# Patient Record
Sex: Female | Born: 1965
Health system: Southern US, Community
[De-identification: ages and names within clinical notes are randomized; demographics above are authoritative.]

## PROBLEM LIST (undated history)

## (undated) DIAGNOSIS — I10 Essential (primary) hypertension: Secondary | ICD-10-CM

---

## 2013-12-05 ENCOUNTER — Emergency Department (HOSPITAL_COMMUNITY)
Admission: EM | Admit: 2013-12-05 | Discharge: 2013-12-05 | Disposition: A | Discharge status: Home / Self Care | Payer: No Typology Code available for payment source | Attending: Emergency Medicine | Admitting: Emergency Medicine

## 2013-12-05 ENCOUNTER — Encounter (HOSPITAL_COMMUNITY): Payer: Self-pay | Admitting: Emergency Medicine

## 2013-12-05 DIAGNOSIS — S29009A Unspecified injury of muscle and tendon of unspecified wall of thorax, initial encounter: Secondary | ICD-10-CM | POA: Insufficient documentation

## 2013-12-05 DIAGNOSIS — Y9389 Activity, other specified: Secondary | ICD-10-CM | POA: Insufficient documentation

## 2013-12-05 DIAGNOSIS — I10 Essential (primary) hypertension: Secondary | ICD-10-CM | POA: Insufficient documentation

## 2013-12-05 DIAGNOSIS — S199XXA Unspecified injury of neck, initial encounter: Secondary | ICD-10-CM | POA: Diagnosis present

## 2013-12-05 DIAGNOSIS — Y92410 Unspecified street and highway as the place of occurrence of the external cause: Secondary | ICD-10-CM | POA: Insufficient documentation

## 2013-12-05 DIAGNOSIS — S39012A Strain of muscle, fascia and tendon of lower back, initial encounter: Secondary | ICD-10-CM | POA: Insufficient documentation

## 2013-12-05 DIAGNOSIS — S161XXA Strain of muscle, fascia and tendon at neck level, initial encounter: Secondary | ICD-10-CM | POA: Diagnosis not present

## 2013-12-05 DIAGNOSIS — T148XXA Other injury of unspecified body region, initial encounter: Secondary | ICD-10-CM

## 2013-12-05 HISTORY — DX: Essential (primary) hypertension: I10

## 2013-12-05 MED ORDER — METHOCARBAMOL 500 MG PO TABS: 1000.0000 mg | ORAL_TABLET | Freq: Four times a day (QID) | ORAL | Status: DC

## 2013-12-05 MED ORDER — METHOCARBAMOL 500 MG PO TABS
500.0000 mg | ORAL_TABLET | Freq: Once | ORAL | Status: AC
  Administered 2013-12-05: 500 mg via ORAL
  Filled 2013-12-05: qty 1
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
## 2013-12-07 NOTE — ED Provider Notes (Signed)
Medical screening examination/treatment/procedure(s) were performed by non-physician practitioner and as supervising physician I was immediately available for consultation/collaboration.   EKG Interpretation None       Khyri Hinzman, MD 12/07/13 1116 

## 2016-12-02 ENCOUNTER — Encounter: Payer: Self-pay | Admitting: *Deleted

## 2016-12-03 ENCOUNTER — Encounter: Payer: Self-pay | Admitting: Diagnostic Neuroimaging

## 2016-12-03 ENCOUNTER — Ambulatory Visit (INDEPENDENT_AMBULATORY_CARE_PROVIDER_SITE_OTHER): Payer: BLUE CROSS/BLUE SHIELD | Admitting: Diagnostic Neuroimaging

## 2016-12-03 ENCOUNTER — Encounter (INDEPENDENT_AMBULATORY_CARE_PROVIDER_SITE_OTHER): Payer: Self-pay

## 2016-12-03 VITALS — BP 133/87 | HR 63 | Ht 63.0 in | Wt 232.2 lb

## 2016-12-03 DIAGNOSIS — H538 Other visual disturbances: Secondary | ICD-10-CM

## 2016-12-03 DIAGNOSIS — G4700 Insomnia, unspecified: Secondary | ICD-10-CM | POA: Diagnosis not present

## 2016-12-03 DIAGNOSIS — R413 Other amnesia: Secondary | ICD-10-CM
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
## 2016-12-03 NOTE — Patient Instructions (Signed)
Thank you for coming to see [place] at [facility]. I hope we have been able to provide you high quality care today.  You may receive a patient satisfaction survey over the next few weeks. We would appreciate your feedback and comments so that we may continue to improve ourselves and the health of our patients.  - check MRI brain and sleep study   ~~~~~~~~~~~~~~~~~~~~~~~~~~~~~~~~~~~~~~~~~~~~~~~~~~~~~~~~~~~~~~~~~  DR. Ileane Sando'S GUIDE TO HAPPY AND HEALTHY LIVING These are some of my general health and wellness recommendations. Some of them may apply to you better than others. Please use common sense as you try these suggestions and feel free to ask me any questions.   ACTIVITY/FITNESS Mental, social, emotional and physical stimulation are very important for brain and body health. Try learning a new activity (arts, music, language, sports, games).  Keep moving your body to the best of your abilities. You can do this at home, inside or outside, the park, community center, gym or anywhere you like. Consider a physical therapist or personal trainer to get started. Consider the app Sworkit. Fitness trackers such as smart-watches, smart-phones or Fitbits can help as well.   NUTRITION Eat more plants: colorful vegetables, nuts, seeds and berries.  Eat less sugar, salt, preservatives and processed foods.  Avoid toxins such as cigarettes and alcohol.  Drink water when you are thirsty. Warm water with a slice of lemon is an excellent morning drink to start the day.  Consider these websites for more information The Nutrition Source (https://www.henry-hernandez.biz/) [employer] Nutrition (WindowBlog.ch)   RELAXATION Consider practicing mindfulness meditation or other relaxation techniques such as deep breathing, prayer, yoga, tai chi, massage. See website mindful.org or the apps Headspace or Calm to help get started.   SLEEP Try to get  at least 7-8+ hours sleep per day. Regular exercise and reduced caffeine will help you sleep better. Practice good sleep hygeine techniques. See website sleep.org for more information.   PLANNING Prepare estate planning, living will, healthcare POA documents. Sometimes this is best planned with the help of an attorney. Theconversationproject.org and agingwithdignity.org are excellent resources.

## 2017-01-07 ENCOUNTER — Ambulatory Visit (INDEPENDENT_AMBULATORY_CARE_PROVIDER_SITE_OTHER): Payer: BLUE CROSS/BLUE SHIELD | Admitting: Neurology

## 2017-01-07 ENCOUNTER — Encounter: Payer: Self-pay | Admitting: Neurology

## 2017-01-07 VITALS — BP 148/92 | HR 70 | Ht 63.0 in | Wt 236.0 lb

## 2017-01-07 DIAGNOSIS — R419 Unspecified symptoms and signs involving cognitive functions and awareness: Secondary | ICD-10-CM

## 2017-01-07 DIAGNOSIS — R413 Other amnesia: Secondary | ICD-10-CM

## 2017-01-07 DIAGNOSIS — R0683 Snoring: Secondary | ICD-10-CM

## 2017-01-07 DIAGNOSIS — G478 Other sleep disorders: Secondary | ICD-10-CM | POA: Diagnosis not present

## 2017-01-07 NOTE — Patient Instructions (Addendum)
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
## 2017-01-09 ENCOUNTER — Telehealth: Payer: Self-pay

## 2017-01-09 DIAGNOSIS — R0683 Snoring: Secondary | ICD-10-CM

## 2017-01-09 DIAGNOSIS — R419 Unspecified symptoms and signs involving cognitive functions and awareness: Secondary | ICD-10-CM

## 2017-01-09 NOTE — Telephone Encounter (Signed)
[employer] denied in lab study, need HST order. 

## 2017-01-12 NOTE — Telephone Encounter (Signed)
HST order placed. 

## 2017-01-22 ENCOUNTER — Telehealth: Payer: Self-pay | Admitting: Diagnostic Neuroimaging

## 2017-01-22 NOTE — Telephone Encounter (Signed)
This patient left a message on my VM that she needs a note for work Please return her call at [phone] dg

## 2017-01-23 NOTE — Telephone Encounter (Signed)
LVM advising patient that this RN was unsure if she needed a note from Dr Marjory LiesPenumalli or Dr Frances FurbishAthar as she has seen both providers in this office. Also questioned what the note needs to state. Advised her this office is now closed, reopens Mon at 8 am, left office number for call back.

## 2017-01-28 ENCOUNTER — Ambulatory Visit (INDEPENDENT_AMBULATORY_CARE_PROVIDER_SITE_OTHER): Payer: BLUE CROSS/BLUE SHIELD | Admitting: Neurology

## 2017-01-28 DIAGNOSIS — G4710 Hypersomnia, unspecified: Secondary | ICD-10-CM

## 2017-01-28 DIAGNOSIS — G479 Sleep disorder, unspecified: Secondary | ICD-10-CM

## 2017-01-28 DIAGNOSIS — R419 Unspecified symptoms and signs involving cognitive functions and awareness: Secondary | ICD-10-CM

## 2017-01-28 DIAGNOSIS — R0683 Snoring: Secondary | ICD-10-CM

## 2017-02-13 NOTE — Progress Notes (Signed)
Patient referred by Dr. Marjory LiesPenumalli, seen by me on 01/07/17, HST on 01/28/17.   Please call and notify the patient that the recent home sleep test did not show any significant obstructive sleep apnea. Patient can follow up with the referring provider. Please remind patient to try to maintain good sleep hygiene, which means: Keep a regular sleep and wake schedule and make enough time for sleep (7 1/2 to 8 1/2 hours for the average adult), try not to exercise or have a meal within 2 hours of your bedtime, try to keep your bedroom conducive for sleep, that is, cool and dark, without light distractors such as an illuminated alarm clock, and refrain from watching TV right before sleep or in the middle of the night and do not keep the TV or radio on during the night. If a nightlight is used, have it away from the visual field. Also, try not to use or play on electronic devices at bedtime, such as your cell phone, tablet PC or laptop. If you like to read at bedtime on an electronic device, try to dim the background light as much as possible. Do not eat in the middle of the night. Keep pets away from the bedroom environment. For stress relief, try meditation, deep breathing exercises (there are many books and CDs available), a white noise machine or fan can help to diffuse other noise distractors, such as traffic noise. Do not drink alcohol before bedtime, as it can disturb sleep and cause middle of the night awakenings. Never mix alcohol and sedating medications! Avoid narcotic pain medication close to bedtime, as opioids/narcotics can suppress breathing drive and breathing effort.    Thanks,  Huston FoleySaima Nguyen Todorov, MD, PhD [facility])
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
## 2017-02-16 ENCOUNTER — Telehealth: Payer: Self-pay

## 2017-02-16 NOTE — Telephone Encounter (Signed)
-----   Message from Huston FoleySaima Athar, MD sent at 02/13/2017 10:13 AM EST ----- Patient referred by Dr. Marjory LiesPenumalli, seen by me on 01/07/17, HST on 01/28/17.   Please call and notify the patient that the recent home sleep test did not show any significant obstructive sleep apnea. Patient can follow up with the referring provider. Please remind patient to try to maintain good sleep hygiene, which means: Keep a regular sleep and wake schedule and make enough time for sleep (7 1/2 to 8 1/2 hours for the average adult), try not to exercise or have a meal within 2 hours of your bedtime, try to keep your bedroom conducive for sleep, that is, cool and dark, without light distractors such as an illuminated alarm clock, and refrain from watching TV right before sleep or in the middle of the night and do not keep the TV or radio on during the night. If a nightlight is used, have it away from the visual field. Also, try not to use or play on electronic devices at bedtime, such as your cell phone, tablet PC or laptop. If you like to read at bedtime on an electronic device, try to dim the background light as much as possible. Do not eat in the middle of the night. Keep pets away from the bedroom environment. For stress relief, try meditation, deep breathing exercises (there are many books and CDs available), a white noise machine or fan can help to diffuse other noise distractors, such as traffic noise. Do not drink alcohol before bedtime, as it can disturb sleep and cause middle of the night awakenings. Never mix alcohol and sedating medications! Avoid narcotic pain medication close to bedtime, as opioids/narcotics can suppress breathing drive and breathing effort.    Thanks,  Huston FoleySaima Athar, MD, PhD [facility])

## 2017-02-16 NOTE — Telephone Encounter (Signed)
I called pt to discuss her sleep study results. No answer, left a message asking her to call me back. 

## 2017-02-16 NOTE — Telephone Encounter (Addendum)
I called pt. I advised pt that Dr. Frances FurbishAthar reviewed pt's sleep study and found that pt did not demonstrate any significant osa. Dr. Frances FurbishAthar recommends that pt follow up with Dr. Marjory LiesPenumalli. I reviewed sleep hygiene recommendations with the pt, including trying to keep a regular sleep wake schedule, avoiding electronics in the bedroom, keeping the bedroom cool, dark, and quiet, and avoiding eating or exercising within 2 hours of bedtime as well as eating in the middle of the night. I advised pt to keep pets away from the bedroom. I discussed with pt the importance of stress relief and to try meditation, deep breathing exercises, and/or a white noise machine or fan to diffuse other noise distracters. I advised pt to not drink alcohol before bedtime and to never mix alcohol and sedating medications. Pt was advised to avoid narcotic pain medication close to bedtime. I advised pt that a copy of these sleep study results will be sent to Dr. Marjory LiesPenumalli. Pt verbalized understanding of results. Pt had no questions at this time but was encouraged to call back if questions arise.

## 2017-03-24 ENCOUNTER — Encounter: Payer: Self-pay | Admitting: Diagnostic Neuroimaging

## 2017-03-24 ENCOUNTER — Ambulatory Visit: Payer: BLUE CROSS/BLUE SHIELD | Admitting: Diagnostic Neuroimaging

## 2017-03-24 ENCOUNTER — Encounter (INDEPENDENT_AMBULATORY_CARE_PROVIDER_SITE_OTHER): Payer: Self-pay

## 2017-03-24 VITALS — BP 139/93 | HR 77 | Wt 230.0 lb

## 2017-03-24 DIAGNOSIS — R413 Other amnesia: Secondary | ICD-10-CM

## 2017-03-24 DIAGNOSIS — R419 Unspecified symptoms and signs involving cognitive functions and awareness: Secondary | ICD-10-CM

## 2017-03-24 DIAGNOSIS — G478 Other sleep disorders: Secondary | ICD-10-CM | POA: Diagnosis not present
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
## 2017-12-18 ENCOUNTER — Other Ambulatory Visit: Payer: Self-pay | Admitting: Family Medicine

## 2017-12-18 ENCOUNTER — Ambulatory Visit
Admission: RE | Admit: 2017-12-18 | Discharge: 2017-12-18 | Disposition: A | Discharge status: Home / Self Care | Payer: PRIVATE HEALTH INSURANCE | Source: Ambulatory Visit | Attending: Family Medicine | Admitting: Family Medicine

## 2017-12-18 DIAGNOSIS — S6992XA Unspecified injury of left wrist, hand and finger(s), initial encounter: Secondary | ICD-10-CM

## 2020-06-25 IMAGING — CR DG FINGER RING 2+V*L*
4 series · 4 of 4 positions shown · non-contrast
Comparison: None.

CLINICAL DATA: Jammed LEFT RING finger this AM / c/o pain at PIP jt
/ just splinted at Dr.[REDACTED] / concern for FX

EXAM:
LEFT RING FINGER 2+V

[x finger pa left]
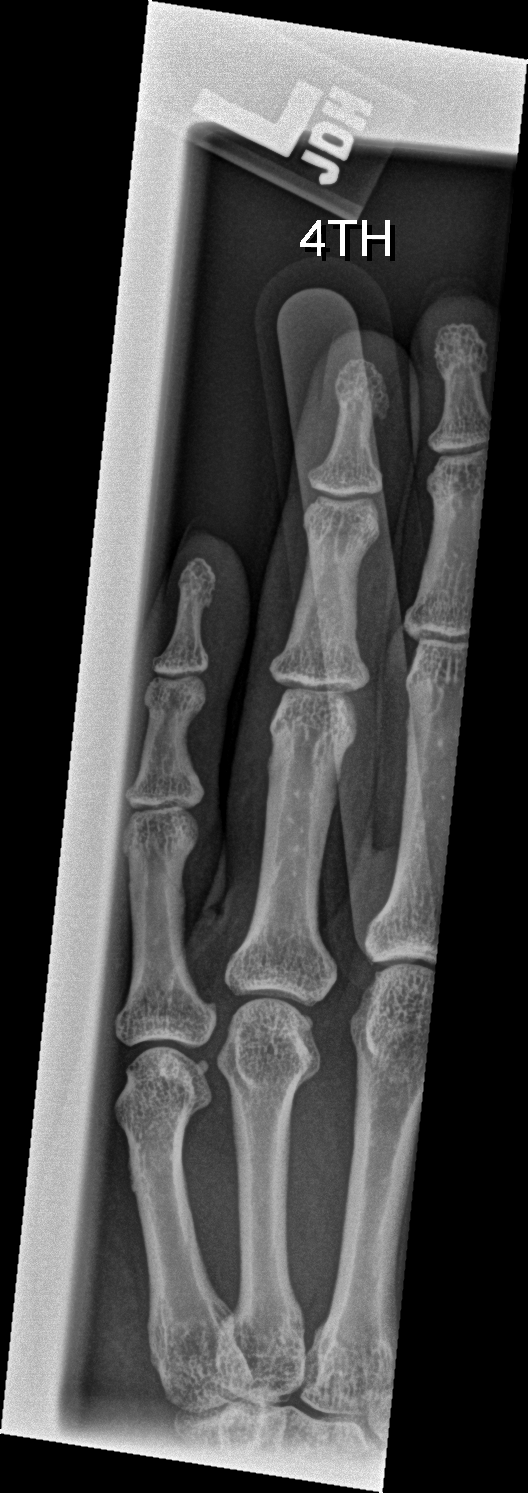

[x finger obl left]
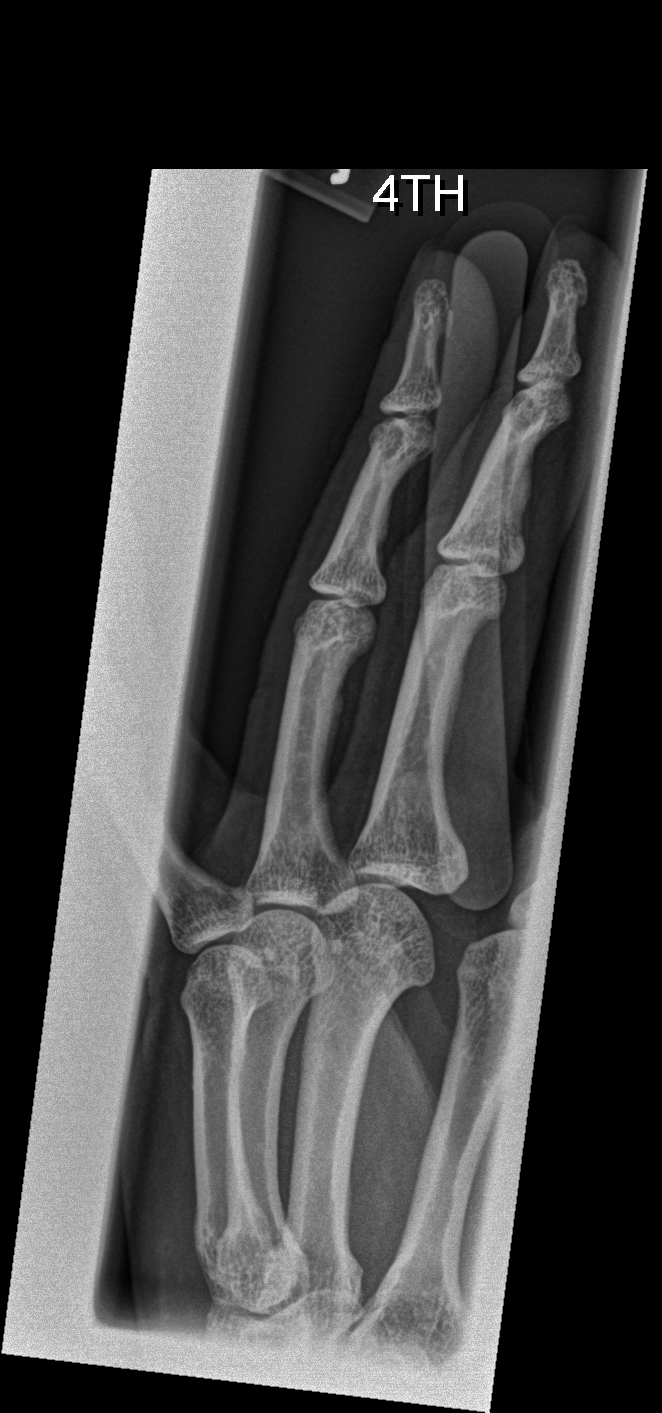

[x finger lat left (1 of 2)]
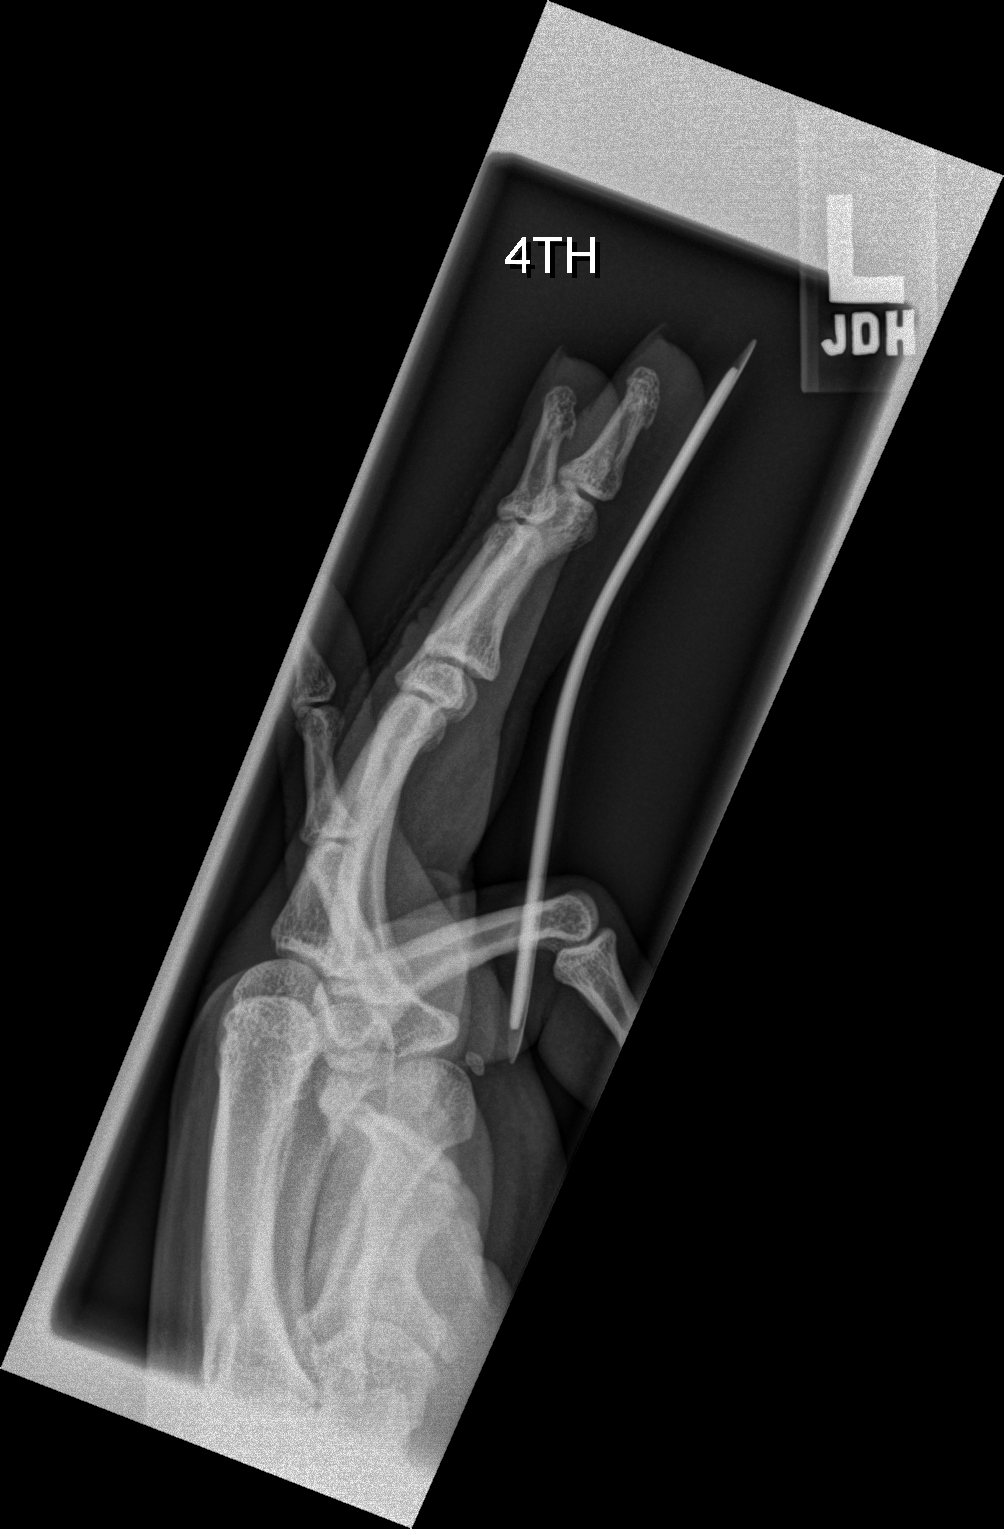

[x finger lat left (2 of 2)]
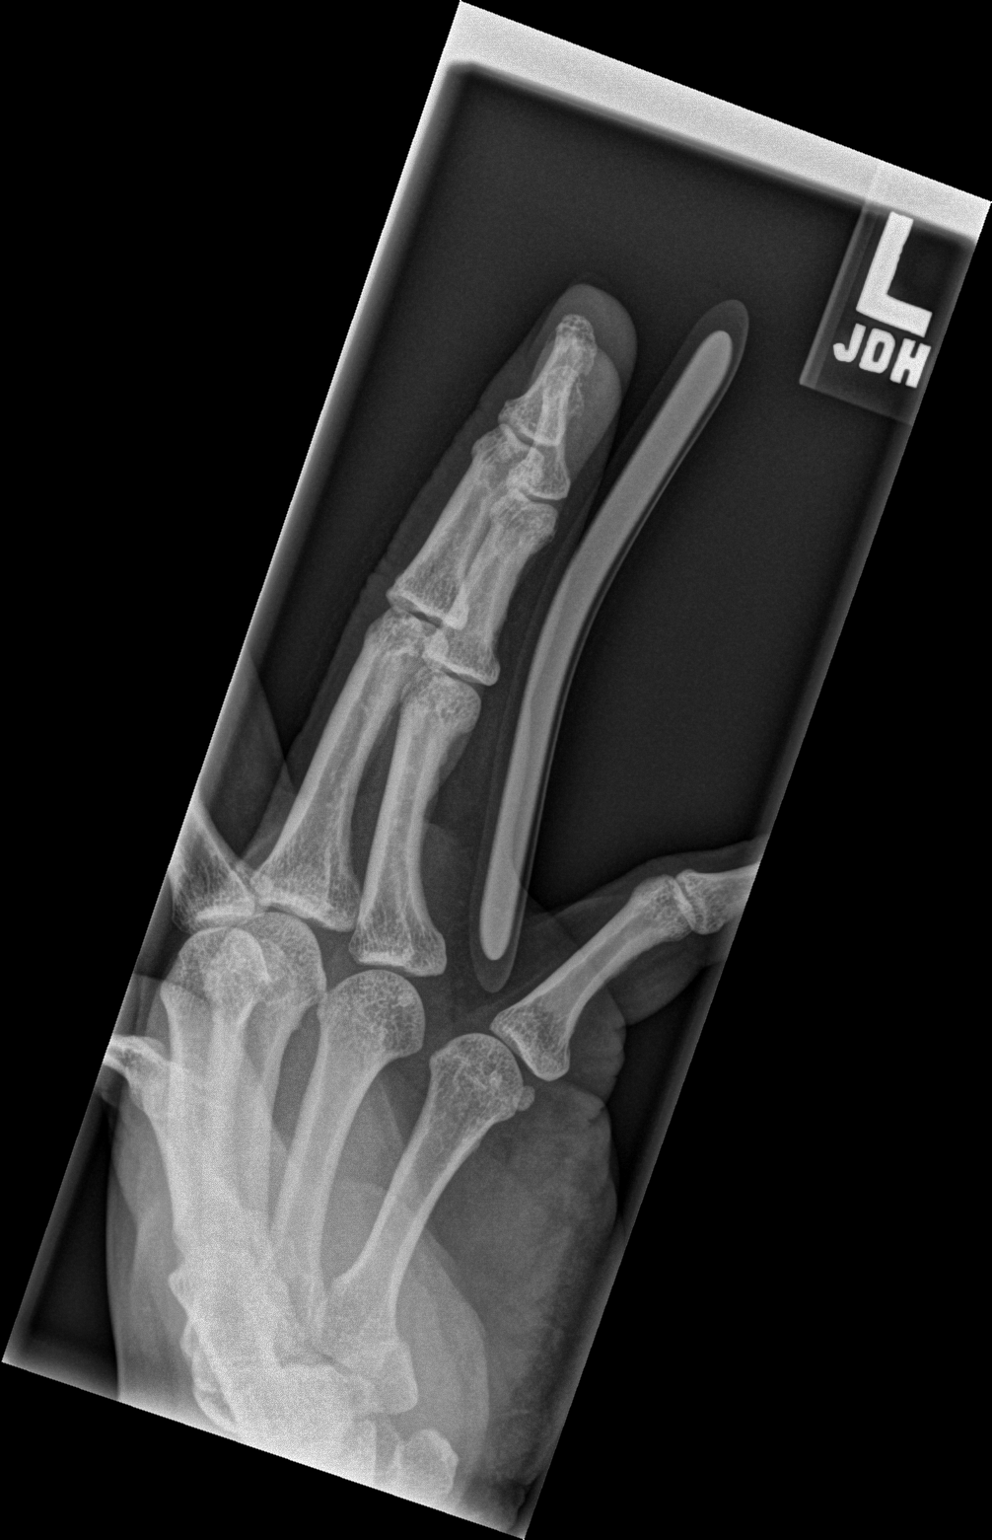

[4 of 4 positions shown; findings below may reference images not displayed]

FINDINGS: No fracture.  No bone lesion.

Joints normally spaced and aligned.  No arthropathic changes.

Soft tissues unremarkable.
IMPRESSION: No fracture or dislocation.

## 2022-04-14 ENCOUNTER — Other Ambulatory Visit (HOSPITAL_BASED_OUTPATIENT_CLINIC_OR_DEPARTMENT_OTHER): Payer: Self-pay

## 2022-04-14 MED ORDER — WEGOVY 0.25 MG/0.5ML ~~LOC~~ SOAJ
0.5000 mL | SUBCUTANEOUS | 0 refills | Status: DC
  Filled 2022-04-14: qty 2, 28d supply, fill #0

## 2022-04-15 ENCOUNTER — Other Ambulatory Visit (HOSPITAL_BASED_OUTPATIENT_CLINIC_OR_DEPARTMENT_OTHER): Payer: Self-pay

## 2022-04-16 ENCOUNTER — Other Ambulatory Visit (HOSPITAL_BASED_OUTPATIENT_CLINIC_OR_DEPARTMENT_OTHER): Payer: Self-pay

## 2022-04-17 ENCOUNTER — Other Ambulatory Visit (HOSPITAL_BASED_OUTPATIENT_CLINIC_OR_DEPARTMENT_OTHER): Payer: Self-pay

## 2022-05-02 ENCOUNTER — Other Ambulatory Visit (HOSPITAL_BASED_OUTPATIENT_CLINIC_OR_DEPARTMENT_OTHER): Payer: Self-pay

## 2022-05-02 MED ORDER — WEGOVY 0.5 MG/0.5ML ~~LOC~~ SOAJ
0.5000 mg | SUBCUTANEOUS | 0 refills | Status: DC
  Filled 2022-05-02: qty 2, 28d supply, fill #0

## 2022-06-10 ENCOUNTER — Other Ambulatory Visit (HOSPITAL_BASED_OUTPATIENT_CLINIC_OR_DEPARTMENT_OTHER): Payer: Self-pay

## 2022-06-10 MED ORDER — WEGOVY 1 MG/0.5ML ~~LOC~~ SOAJ
0.5000 mL | SUBCUTANEOUS | 0 refills | Status: DC
  Filled 2022-06-10: qty 2, 28d supply, fill #0

## 2022-06-11 ENCOUNTER — Other Ambulatory Visit (HOSPITAL_BASED_OUTPATIENT_CLINIC_OR_DEPARTMENT_OTHER): Payer: Self-pay

## 2022-07-08 ENCOUNTER — Other Ambulatory Visit (HOSPITAL_BASED_OUTPATIENT_CLINIC_OR_DEPARTMENT_OTHER): Payer: Self-pay

## 2022-07-08 MED ORDER — WEGOVY 1.7 MG/0.75ML ~~LOC~~ SOAJ
1.7000 mg | SUBCUTANEOUS | 0 refills | Status: DC
  Filled 2022-07-08: qty 3, 28d supply, fill #0

## 2022-07-10 ENCOUNTER — Other Ambulatory Visit (HOSPITAL_BASED_OUTPATIENT_CLINIC_OR_DEPARTMENT_OTHER): Payer: Self-pay

## 2022-07-17 ENCOUNTER — Emergency Department (HOSPITAL_COMMUNITY): Payer: PRIVATE HEALTH INSURANCE

## 2022-07-17 ENCOUNTER — Encounter (HOSPITAL_COMMUNITY): Payer: Self-pay

## 2022-07-17 ENCOUNTER — Emergency Department (HOSPITAL_COMMUNITY)
Admission: EM | Admit: 2022-07-17 | Discharge: 2022-07-17 | Disposition: A | Discharge status: Home / Self Care | Payer: PRIVATE HEALTH INSURANCE | Attending: Emergency Medicine | Admitting: Emergency Medicine

## 2022-07-17 ENCOUNTER — Other Ambulatory Visit (HOSPITAL_COMMUNITY): Payer: Self-pay

## 2022-07-17 DIAGNOSIS — Z79899 Other long term (current) drug therapy: Secondary | ICD-10-CM | POA: Insufficient documentation

## 2022-07-17 DIAGNOSIS — S0081XA Abrasion of other part of head, initial encounter: Secondary | ICD-10-CM | POA: Diagnosis not present

## 2022-07-17 DIAGNOSIS — W108XXA Fall (on) (from) other stairs and steps, initial encounter: Secondary | ICD-10-CM | POA: Diagnosis not present

## 2022-07-17 DIAGNOSIS — S3991XA Unspecified injury of abdomen, initial encounter: Secondary | ICD-10-CM | POA: Diagnosis not present

## 2022-07-17 DIAGNOSIS — I10 Essential (primary) hypertension: Secondary | ICD-10-CM | POA: Insufficient documentation

## 2022-07-17 DIAGNOSIS — S0990XA Unspecified injury of head, initial encounter: Secondary | ICD-10-CM | POA: Insufficient documentation

## 2022-07-17 DIAGNOSIS — Z794 Long term (current) use of insulin: Secondary | ICD-10-CM | POA: Diagnosis not present

## 2022-07-17 DIAGNOSIS — Y92039 Unspecified place in apartment as the place of occurrence of the external cause: Secondary | ICD-10-CM | POA: Diagnosis not present

## 2022-07-17 DIAGNOSIS — S40012A Contusion of left shoulder, initial encounter: Secondary | ICD-10-CM | POA: Insufficient documentation

## 2022-07-17 DIAGNOSIS — M542 Cervicalgia: Secondary | ICD-10-CM | POA: Diagnosis not present

## 2022-07-17 DIAGNOSIS — W19XXXA Unspecified fall, initial encounter: Secondary | ICD-10-CM

## 2022-07-17 DIAGNOSIS — S299XXA Unspecified injury of thorax, initial encounter: Secondary | ICD-10-CM | POA: Diagnosis not present

## 2022-07-17 DIAGNOSIS — S4992XA Unspecified injury of left shoulder and upper arm, initial encounter: Secondary | ICD-10-CM | POA: Diagnosis present

## 2022-07-17 LAB — COMPREHENSIVE METABOLIC PANEL
ALT: 9 U/L (ref 0–44)
AST: 14 U/L — ABNORMAL LOW (ref 15–41)
Albumin: 3.3 g/dL — ABNORMAL LOW (ref 3.5–5.0)
Alkaline Phosphatase: 55 U/L (ref 38–126)
Anion gap: 9 (ref 5–15)
BUN: 13 mg/dL (ref 6–20)
CO2: 23 mmol/L (ref 22–32)
Calcium: 8.6 mg/dL — ABNORMAL LOW (ref 8.9–10.3)
Chloride: 109 mmol/L (ref 98–111)
Creatinine, Ser: 0.86 mg/dL (ref 0.44–1.00)
GFR, Estimated: >60 mL/min (ref >60)
Glucose, Bld: 96 mg/dL (ref 70–99)
Potassium: 3.9 mmol/L (ref 3.5–5.1)
Sodium: 141 mmol/L (ref 135–145)
Total Bilirubin: 1.1 mg/dL (ref 0.3–1.2)
Total Protein: 6 g/dL — ABNORMAL LOW (ref 6.5–8.1)

## 2022-07-17 LAB — I-STAT CHEM 8, ED
BUN: 13 mg/dL (ref 6–20)
Calcium, Ion: 1.04 mmol/L — ABNORMAL LOW (ref 1.15–1.40)
Chloride: 107 mmol/L (ref 98–111)
Creatinine, Ser: 0.8 mg/dL (ref 0.44–1.00)
Glucose, Bld: 98 mg/dL (ref 70–99)
HCT: 40 % (ref 36.0–46.0)
Hemoglobin: 13.6 g/dL (ref 12.0–15.0)
Potassium: 3.9 mmol/L (ref 3.5–5.1)
Sodium: 143 mmol/L (ref 135–145)
TCO2: 25 mmol/L (ref 22–32)

## 2022-07-17 LAB — CBC
HCT: 41.9 % (ref 36.0–46.0)
Hemoglobin: 13.4 g/dL (ref 12.0–15.0)
MCH: 26.8 pg (ref 26.0–34.0)
MCHC: 32 g/dL (ref 30.0–36.0)
MCV: 83.8 fL (ref 80.0–100.0)
Platelets: 230 10*3/uL (ref 150–400)
RBC: 5 MIL/uL (ref 3.87–5.11)
RDW: 14.4 % (ref 11.5–15.5)
WBC: 7.5 10*3/uL (ref 4.0–10.5)
nRBC: 0 % (ref 0.0–0.2)

## 2022-07-17 LAB — I-STAT BETA HCG BLOOD, ED (MC, WL, AP ONLY): I-stat hCG, quantitative: <5 m[IU]/mL (ref <5)

## 2022-07-17 LAB — CBG MONITORING, ED: Glucose-Capillary: 101 mg/dL — ABNORMAL HIGH (ref 70–99)

## 2022-07-17 MED ORDER — MORPHINE SULFATE (PF) 4 MG/ML IV SOLN
4.0000 mg | Freq: Once | INTRAVENOUS | Status: AC
  Administered 2022-07-17: 4 mg via INTRAVENOUS
  Filled 2022-07-17: qty 1

## 2022-07-17 MED ORDER — ONDANSETRON HCL 4 MG/2ML IJ SOLN
4.0000 mg | Freq: Once | INTRAMUSCULAR | Status: AC
  Administered 2022-07-17: 4 mg via INTRAVENOUS
  Filled 2022-07-17: qty 2

## 2022-07-17 MED ORDER — ACETAMINOPHEN 325 MG PO TABS
325.0000 mg | ORAL_TABLET | Freq: Once | ORAL | Status: AC
  Administered 2022-07-17: 325 mg via ORAL
  Filled 2022-07-17: qty 1

## 2022-07-17 MED ORDER — IOHEXOL 350 MG/ML SOLN
100.0000 mL | Freq: Once | INTRAVENOUS | Status: AC | PRN
  Administered 2022-07-17: 100 mL via INTRAVENOUS

## 2022-07-17 MED ORDER — METHOCARBAMOL 500 MG PO TABS
500.0000 mg | ORAL_TABLET | Freq: Two times a day (BID) | ORAL | 0 refills | Status: AC
  Filled 2022-07-17: qty 14, 7d supply, fill #0

## 2022-07-17 NOTE — ED Triage Notes (Addendum)
Pt reports missing a step from top of stairs. Her dog pulled her down. Fell 15 steps and landed onto concrete floor. Neighbors called 911. Alert and oriented x 4. GCS 15. Possible LOC. Reports back pain and left shoulder pain. Abrasion to right forehead and abrasion to toes. EMS gave Fentanyl.

## 2022-07-17 NOTE — ED Provider Notes (Signed)
[place] EMERGENCY DEPARTMENT AT [facility] : [facility] Provider Note   CSN: [id number] Arrival date & time: 07/17/22  4098     History  Chief Complaint  Patient presents with   Marletta Lor    Tamara Ellis is a 57 y.o. female with a history of hypertension who presents the ED via EMS today after a fall.  She received 100 mg of Fentanyl in transit.  She fell down 15 steps on stairs of her apartment home and landed on concrete floor.  She does not know if she hit her head or lost consciousness.  She reports pain to her neck, shoulders, and back but denies headache, chest pain, abdominal pain, or decreased range of motion to her extremities.  She does not take any blood thinners.    Home Medications Prior to Admission medications   Medication Sig Start Date End Date Taking? Authorizing Provider  methocarbamol (ROBAXIN) 500 MG tablet Take 1 tablet (500 mg total) by mouth 2 (two) times daily for 7 days. 07/17/22 07/24/22 Yes Maxwell Marion, PA-C  fenoprofen (NALFON) 600 MG TABS tablet Take 600 mg by mouth.    [provider]  loratadine (CLARITIN) 10 MG tablet Take 10 mg by mouth daily.    [provider]  losartan-hydrochlorothiazide (HYZAAR) 100-12.5 MG tablet daily. 10/28/16   [provider]  Omega-3 Fatty Acids (FISH OIL) 1000 MG CAPS Take by mouth.    [provider]  Semaglutide-Weight Management (WEGOVY) 1 MG/0.5ML SOAJ Inject 1 mg into the skin once a week. 06/10/22     Semaglutide-Weight Management (WEGOVY) 1.7 MG/0.75ML SOAJ Inject 1.7 mg into the skin once a week. 07/08/22     vitamin B-12 (CYANOCOBALAMIN) 1000 MCG tablet Take 1,000 mcg by mouth daily.    [provider]      Allergies    Lisinopril    Review of Systems   Review of Systems  Musculoskeletal:  Positive for back pain and neck pain.  All other systems reviewed and are negative.   Physical Exam Updated Vital Signs BP 139/88   Pulse 63   Temp 98 F (36.7 C) (Oral)   Resp  10   Ht 5\' 3"  (1.6 m)   Wt 104.3 kg   SpO2 100%   BMI 40.74 kg/m  Physical Exam Vitals and nursing note reviewed.  Constitutional:      Appearance: Normal appearance.  HENT:     Head: Normocephalic and atraumatic.     Mouth/Throat:     Mouth: Mucous membranes are moist.  Eyes:     Conjunctiva/sclera: Conjunctivae normal.     Pupils: Pupils are equal, round, and reactive to light.  Neck:     Comments: C-collar present Cardiovascular:     Rate and Rhythm: Normal rate and regular rhythm.     Pulses: Normal pulses.     Heart sounds: Normal heart sounds.  Pulmonary:     Effort: Pulmonary effort is normal.     Breath sounds: Normal breath sounds.  Abdominal:     Palpations: Abdomen is soft.     Tenderness: There is no abdominal tenderness.  Musculoskeletal:        General: Tenderness present.     Comments: Tenderness to the shoulders and right side of her back  Skin:    General: Skin is warm and dry.     Findings: Bruising present. No rash.     Comments: Abrasion to left forehead and bruising at the left collarbone  Neurological:  General: No focal deficit present.     Mental Status: She is alert.  Psychiatric:        Mood and Affect: Mood normal.        Behavior: Behavior normal.     ED Results / Procedures / Treatments   Labs (all labs ordered are listed, but only abnormal results are displayed) Labs Reviewed  COMPREHENSIVE METABOLIC PANEL - Abnormal; Notable for the following components:      Result Value   Calcium 8.6 (*)    Total Protein 6.0 (*)    Albumin 3.3 (*)    AST 14 (*)    All other components within normal limits  CBG MONITORING, ED - Abnormal; Notable for the following components:   Glucose-Capillary 101 (*)    All other components within normal limits  I-STAT CHEM 8, ED - Abnormal; Notable for the following components:   Calcium, Ion 1.04 (*)    All other components within normal limits  CBC  I-STAT BETA HCG BLOOD, ED ([facility], [facility], [facility] ONLY)     EKG EKG Interpretation  Date/Time:  Thursday July 17 2022 09:31:57 EDT Ventricular Rate:  66 PR Interval:  160 QRS Duration: 82 QT Interval:  400 QTC Calculation: 420 R Axis:   37 Text Interpretation: Sinus rhythm Normal ECG No old tracing to compare Confirmed by Eber Hong ([id number]) on 07/17/2022 9:35:40 AM  Radiology CT CHEST ABDOMEN PELVIS W CONTRAST  Result Date: 07/17/2022 CLINICAL DATA:  Patient reportedly missed a step at the top of the stairs and was pulled down by her dog, falling approximately 15 steps and landing onto concrete. Possible loss of consciousness. Back and left shoulder pain. EXAM: CT CHEST, ABDOMEN, AND PELVIS WITH CONTRAST TECHNIQUE: Multidetector CT imaging of the chest, abdomen and pelvis was performed following the standard protocol during bolus administration of intravenous contrast. RADIATION DOSE REDUCTION: This exam was performed according to the departmental dose-optimization program which includes automated exposure control, adjustment of the mA and/or kV according to patient size and/or use of iterative reconstruction technique. CONTRAST:  OMNIPAQUE IOHEXOL 350 MG/ML SOLN COMPARISON:  None Available. FINDINGS: CT CHEST FINDINGS Cardiovascular: Heart is normal in size and configuration. No pericardial effusion. Great vessels are normal in caliber. No vascular injury. No aortic dissection or atherosclerosis. Arch branch vessels are widely patent. Mediastinum/Nodes: Heterogeneous 2.8 cm left thyroid nodule. No neck base, mediastinal or hilar masses. No enlarged lymph nodes. No mediastinal hematoma. Trachea and esophagus are unremarkable. Lungs/Pleura: Minimal dependent subsegmental atelectasis. Mild atelectasis or scarring in the dependent left upper lobe lingula. No evidence of a lung contusion or laceration. No pneumonia or edema. No mass or nodule. No pleural effusion or pneumothorax. Musculoskeletal: No fracture or acute finding. No bone lesion. No chest  wall mass or contusion. CT ABDOMEN PELVIS FINDINGS Hepatobiliary: Normal liver. Small dependent gallstone. Gallbladder otherwise unremarkable. No bile duct dilation. Pancreas: Normal. Spleen: Normal. Adrenals/Urinary Tract: No adrenal mass or hemorrhage. Normal kidneys, ureters and bladder. Stomach/Bowel: Normal stomach. Small bowel and colon are normal in caliber. No wall thickening or inflammation. Left colon diverticula. No evidence of bowel or mesenteric injury. Vascular/Lymphatic: No vascular injury or abnormality. No enlarged lymph nodes. Reproductive: Right lateral exophytic uterine fibroid, 3 cm in size. Well-positioned IUD. No adnexal masses. Other: No hernia or ascites.  No abdominal wall contusion. Musculoskeletal: No fracture. Bilateral hip joint arthropathic changes and os acetabula. IMPRESSION: 1. No acute findings. No evidence of acute injury to the chest, abdomen or pelvis. 2.  3 cm left thyroid nodule. Recommend thyroid US (ref: J Am Coll Radiol. 2015 Feb;12(2): 143-50). Electronically Signed   By: Amie Portland M.D.   On: 07/17/2022 12:34   CT HEAD WO CONTRAST  Result Date: 07/17/2022 CLINICAL DATA:  Head trauma, moderate-severe; Neck pain, acute, no red flags Neck trauma, midline tenderness (Age 75-64y). EXAM: CT HEAD WITHOUT CONTRAST CT CERVICAL SPINE WITHOUT CONTRAST TECHNIQUE: Multidetector CT imaging of the head and cervical spine was performed following the standard protocol without intravenous contrast. Multiplanar CT image reconstructions of the cervical spine were also generated. RADIATION DOSE REDUCTION: This exam was performed according to the departmental dose-optimization program which includes automated exposure control, adjustment of the mA and/or kV according to patient size and/or use of iterative reconstruction technique. COMPARISON:  Head CT 07/15/2021.  Brain MRI 07/16/2021. FINDINGS: CT HEAD FINDINGS Brain: No acute intracranial hemorrhage. Gray-white differentiation is  preserved. No hydrocephalus or extra-axial collection. No mass effect or midline shift. Vascular: No hyperdense vessel or unexpected calcification. Skull: No calvarial fracture or suspicious bone lesion. Skull base is unremarkable. Sinuses/Orbits: Unremarkable. Other: Small left frontal scalp contusion. CT CERVICAL SPINE FINDINGS Alignment: Normal. Skull base and vertebrae: No acute fracture. Normal craniocervical junction. No suspicious bone lesions. Soft tissues and spinal canal: No prevertebral fluid or swelling. No visible canal hematoma. Disc levels: Mild cervical spondylosis without high-grade spinal canal stenosis. Upper chest: Unremarkable. Other: Heterogeneous enlargement of the left thyroid lobe. IMPRESSION: 1. No acute intracranial abnormality. Small left frontal scalp contusion. 2. No acute cervical spine fracture or traumatic malalignment. 3. Heterogeneous enlargement of the left thyroid lobe. Recommend nonurgent thyroid ultrasound (ref: J Am Coll Radiol. 2015 Feb;12(2): 143-50). Electronically Signed   By: Orvan Falconer M.D.   On: 07/17/2022 12:21   CT Cervical Spine Wo Contrast  Result Date: 07/17/2022 CLINICAL DATA:  Head trauma, moderate-severe; Neck pain, acute, no red flags Neck trauma, midline tenderness (Age 75-64y). EXAM: CT HEAD WITHOUT CONTRAST CT CERVICAL SPINE WITHOUT CONTRAST TECHNIQUE: Multidetector CT imaging of the head and cervical spine was performed following the standard protocol without intravenous contrast. Multiplanar CT image reconstructions of the cervical spine were also generated. RADIATION DOSE REDUCTION: This exam was performed according to the departmental dose-optimization program which includes automated exposure control, adjustment of the mA and/or kV according to patient size and/or use of iterative reconstruction technique. COMPARISON:  Head CT 07/15/2021.  Brain MRI 07/16/2021. FINDINGS: CT HEAD FINDINGS Brain: No acute intracranial hemorrhage. Gray-white  differentiation is preserved. No hydrocephalus or extra-axial collection. No mass effect or midline shift. Vascular: No hyperdense vessel or unexpected calcification. Skull: No calvarial fracture or suspicious bone lesion. Skull base is unremarkable. Sinuses/Orbits: Unremarkable. Other: Small left frontal scalp contusion. CT CERVICAL SPINE FINDINGS Alignment: Normal. Skull base and vertebrae: No acute fracture. Normal craniocervical junction. No suspicious bone lesions. Soft tissues and spinal canal: No prevertebral fluid or swelling. No visible canal hematoma. Disc levels: Mild cervical spondylosis without high-grade spinal canal stenosis. Upper chest: Unremarkable. Other: Heterogeneous enlargement of the left thyroid lobe. IMPRESSION: 1. No acute intracranial abnormality. Small left frontal scalp contusion. 2. No acute cervical spine fracture or traumatic malalignment. 3. Heterogeneous enlargement of the left thyroid lobe. Recommend nonurgent thyroid ultrasound (ref: J Am Coll Radiol. 2015 Feb;12(2): 143-50). Electronically Signed   By: Orvan Falconer M.D.   On: 07/17/2022 12:21    Procedures Procedures: not indicated   Medications Ordered in ED Medications  morphine (PF) 4 MG/ML injection 4 mg (has no administration  in time range)  morphine (PF) 4 MG/ML injection 4 mg (4 mg Intravenous Given 07/17/22 1029)  ondansetron (ZOFRAN) injection 4 mg (4 mg Intravenous Given 07/17/22 1029)  acetaminophen (TYLENOL) tablet 325 mg (325 mg Oral Given 07/17/22 1227)  iohexol (OMNIPAQUE) 350 MG/ML injection 100 mL (100 mLs Intravenous Contrast Given 07/17/22 1152)    ED Course/ Medical Decision Making/ A&P                             Medical Decision Making Amount and/or Complexity of Data Reviewed Labs: ordered. Radiology: ordered.   This patient presents to the ED for concern of fall down stairs, this involves an extensive number of treatment options, and is a complaint that carries with it a high risk of  complications and morbidity.   Differential diagnosis includes: subarachnoid hemorrhage, subdural hematoma,   Co morbidities that complicate the patient evaluation  Hypertension   Cardiac Monitoring / EKG:  The patient was maintained on a cardiac monitor.  I personally viewed and interpreted the cardiac monitored which showed: normal sinus rhythm with a heart rate of 66 bpm.   Lab Tests:  I ordered and personally interpreted labs.  The pertinent results include:   Negative pregnancy test CBC and i-stat Chem 8 are within normal limits.    Imaging Studies ordered:  I ordered imaging studies including CT head non-contrast, CT neck, CT chest/abdomen/pelvis  I independently visualized and interpreted imaging which showed:  No acute intracranial abnormality. Small left frontal scalp contusion. No acute cervical spine fracture or traumatic malalignment. No acute chest/abdomen/pelvis findings. No evidence of acute injury to the chest, abdomen or pelvis. 3 cm left thyroid nodule. Recommend thyroid US I agree with the radiologist interpretation   Problem List / ED Course / Critical interventions / Medication management  Fall down stairs Patient is teary eyed and reports pain and headache I ordered medications including: Morphine for pain  Tylenol for headache Reevaluation of the patient after these medicines showed that the patient improved I have reviewed the patients home medicines and have made adjustments as needed   Social Determinants of Health:  Housing   Test / Admission - Considered:  Patient is hemodynamically stable and safe for discharge home.          Final Clinical Impression(s) / ED Diagnoses Final diagnoses:  Fall, initial encounter    Rx / DC Orders ED Discharge Orders          Ordered    methocarbamol (ROBAXIN) 500 MG tablet  2 times daily        07/17/22 1259              Maxwell Marion, PA-C 07/17/22 1305    Eber Hong,  MD 07/17/22 1315

## 2022-07-17 NOTE — ED Notes (Signed)
Got patient undressed into a gown on the monitor did EKG shown to Dr Hyacinth Meeker got patient some warm blankets and patient has call bell in reach

## 2022-07-17 NOTE — Discharge Instructions (Addendum)
Take Robaxin twice a day as needed for pain. You can use ice as well.   Follow up with your primary care provider for further evaluation of thyroid nodules as discussed.  Return to ED if your pain worsens or becomes persistent, you develop intractable headache, develop weakness or vision changes.

## 2022-08-06 ENCOUNTER — Other Ambulatory Visit (HOSPITAL_BASED_OUTPATIENT_CLINIC_OR_DEPARTMENT_OTHER): Payer: Self-pay

## 2022-08-07 ENCOUNTER — Other Ambulatory Visit (HOSPITAL_BASED_OUTPATIENT_CLINIC_OR_DEPARTMENT_OTHER): Payer: Self-pay

## 2022-08-07 MED ORDER — WEGOVY 2.4 MG/0.75ML ~~LOC~~ SOAJ
2.4000 mg | SUBCUTANEOUS | 0 refills | Status: DC
  Filled 2022-08-07 (×2): qty 3, 28d supply, fill #0

## 2022-08-13 ENCOUNTER — Other Ambulatory Visit (HOSPITAL_BASED_OUTPATIENT_CLINIC_OR_DEPARTMENT_OTHER): Payer: Self-pay

## 2022-08-13 MED ORDER — METHOCARBAMOL 500 MG PO TABS
500.0000 mg | ORAL_TABLET | Freq: Two times a day (BID) | ORAL | 0 refills | Status: AC | PRN
  Filled 2022-08-13: qty 60, 30d supply, fill #0

## 2022-08-13 MED ORDER — WEGOVY 2.4 MG/0.75ML ~~LOC~~ SOAJ
2.4000 mg | SUBCUTANEOUS | 2 refills | Status: DC
  Filled 2022-08-15 – 2022-09-02 (×4): qty 3, 28d supply, fill #0

## 2022-08-15 ENCOUNTER — Other Ambulatory Visit (HOSPITAL_BASED_OUTPATIENT_CLINIC_OR_DEPARTMENT_OTHER): Payer: Self-pay

## 2022-08-28 ENCOUNTER — Other Ambulatory Visit (HOSPITAL_BASED_OUTPATIENT_CLINIC_OR_DEPARTMENT_OTHER): Payer: Self-pay

## 2022-09-02 ENCOUNTER — Other Ambulatory Visit (HOSPITAL_BASED_OUTPATIENT_CLINIC_OR_DEPARTMENT_OTHER): Payer: Self-pay

## 2022-09-02 ENCOUNTER — Other Ambulatory Visit: Payer: Self-pay
